# Patient Record
Sex: Male | Born: 1990 | Race: Black or African American | Hispanic: No | Smoking: Former smoker
Health system: Southern US, Community
[De-identification: ages and names within clinical notes are randomized; demographics above are authoritative.]

## PROBLEM LIST (undated history)

## (undated) DIAGNOSIS — D573 Sickle-cell trait: Secondary | ICD-10-CM

## (undated) DIAGNOSIS — J45909 Unspecified asthma, uncomplicated: Secondary | ICD-10-CM

## (undated) HISTORY — DX: Unspecified asthma, uncomplicated: J45.909

## (undated) HISTORY — DX: Sickle-cell trait: D57.3

---

## 2012-03-22 DIAGNOSIS — R7303 Prediabetes: Secondary | ICD-10-CM

## 2012-03-22 HISTORY — DX: Prediabetes: R73.03

## 2020-02-20 DIAGNOSIS — U071 COVID-19: Secondary | ICD-10-CM

## 2020-02-20 HISTORY — DX: COVID-19: U07.1

## 2020-09-15 ENCOUNTER — Other Ambulatory Visit: Payer: Self-pay

## 2020-09-15 ENCOUNTER — Emergency Department
Admission: EM | Admit: 2020-09-15 | Discharge: 2020-09-15 | Disposition: A | Discharge status: Home / Self Care | Payer: 59 | Attending: Emergency Medicine | Admitting: Emergency Medicine

## 2020-09-15 ENCOUNTER — Emergency Department: Payer: 59

## 2020-09-15 DIAGNOSIS — Y92481 Parking lot as the place of occurrence of the external cause: Secondary | ICD-10-CM | POA: Diagnosis not present

## 2020-09-15 DIAGNOSIS — M545 Low back pain, unspecified: Secondary | ICD-10-CM | POA: Insufficient documentation

## 2020-09-15 DIAGNOSIS — M542 Cervicalgia: Secondary | ICD-10-CM | POA: Insufficient documentation

## 2020-09-15 MED ORDER — METHOCARBAMOL 500 MG PO TABS
750.0000 mg | ORAL_TABLET | Freq: Once | ORAL | Status: AC
  Administered 2020-09-15: 750 mg via ORAL

## 2020-09-15 MED ORDER — MELOXICAM 7.5 MG PO TABS
15.0000 mg | ORAL_TABLET | Freq: Once | ORAL | Status: AC
  Administered 2020-09-15: 15 mg via ORAL
  Filled 2020-09-15: qty 2

## 2020-09-15 MED ORDER — ACETAMINOPHEN 325 MG PO TABS
650.0000 mg | ORAL_TABLET | Freq: Once | ORAL | Status: AC
  Administered 2020-09-15: 650 mg via ORAL
  Filled 2020-09-15: qty 2

## 2020-09-15 MED ORDER — MELOXICAM 15 MG PO TABS: 15.0000 mg | ORAL_TABLET | Freq: Every day | ORAL | 0 refills | Status: AC

## 2020-09-15 MED ORDER — METHOCARBAMOL 750 MG PO TABS: 750.0000 mg | ORAL_TABLET | Freq: Four times a day (QID) | ORAL | 0 refills | Status: AC | PRN
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
## 2020-09-15 NOTE — Discharge Instructions (Addendum)
Please take robaxin and mobic as prescribed. You  may also take Tylenol, up to 1000mg  4x daily as needed for pain. Return to the ER if you develop any worsening, otherwise follow up with your primary care.

## 2020-09-17 ENCOUNTER — Other Ambulatory Visit: Payer: Self-pay

## 2020-09-17 ENCOUNTER — Encounter: Payer: Self-pay | Admitting: Family Medicine

## 2020-09-17 ENCOUNTER — Ambulatory Visit: Payer: 59 | Admitting: Family Medicine

## 2020-09-17 VITALS — BP 122/74 | HR 64 | Temp 98.5°F | Ht 67.0 in | Wt 184.0 lb

## 2020-09-17 DIAGNOSIS — M62830 Muscle spasm of back: Secondary | ICD-10-CM | POA: Insufficient documentation

## 2020-09-17 DIAGNOSIS — Z23 Encounter for immunization: Secondary | ICD-10-CM | POA: Insufficient documentation

## 2020-09-17 DIAGNOSIS — M62838 Other muscle spasm: Secondary | ICD-10-CM

## 2020-09-17 MED ORDER — PREDNISONE 50 MG PO TABS: 50.0000 mg | ORAL_TABLET | Freq: Every day | ORAL | 0 refills | Status: DC
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
## 2020-09-17 NOTE — Patient Instructions (Signed)
-   Start meloxicam and dose once daily with food - Start methocarbamol, dose nightly (side effect can be drowsiness), additional 3 doses can be taken throughout the day (no driving/machinery for 8 hours after dosing) - If symptoms fail to respond to medications by Saturday, start prednisone and dose for full course - Start home exercises with information provided, focus on slow and steady advance using symptoms as a guide - Return for follow-up in 2 weeks, contact us for questions between now and then
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
## 2020-11-20 ENCOUNTER — Encounter: Payer: Self-pay | Admitting: Family Medicine

## 2020-11-20 ENCOUNTER — Other Ambulatory Visit: Payer: Self-pay

## 2020-11-20 ENCOUNTER — Ambulatory Visit (INDEPENDENT_AMBULATORY_CARE_PROVIDER_SITE_OTHER): Payer: 59 | Admitting: Family Medicine

## 2020-11-20 VITALS — BP 108/68 | HR 72 | Temp 98.4°F | Ht 67.0 in | Wt 178.0 lb

## 2020-11-20 DIAGNOSIS — M62838 Other muscle spasm: Secondary | ICD-10-CM | POA: Diagnosis not present

## 2020-11-20 DIAGNOSIS — J869 Pyothorax without fistula: Secondary | ICD-10-CM

## 2020-11-20 NOTE — Assessment & Plan Note (Addendum)
Patient presents 1 week following abscess incision and drainage at [facility] ER on 11/13/2020.  He has been compliant with antibiotic course, completed Bactrim, followed with Keflex which she is currently dosing, has an additional few days left.  Denies any fevers, chills, drainage from wound.  He can still feel thickening at the area of involvement.  Physical examination reveals 1 cm longitudinal midsternal well-healed/healing incision, mild induration surrounding, no erythema, no streaking, no fluctuance underneath, deep palpation does express scant amount of firm purulent material, further palpation benign.  This area is nontender.  Wound was cleaned and a new bandage was placed.  His physical exam findings show resolving chest abscess, have advised him to finish out remaining antibiotics, perform daily wound care, and contact us for any worsening symptoms.  He can otherwise follow-up on as-needed basis.

## 2020-11-20 NOTE — Patient Instructions (Signed)
-   Finish out remaining antibiotics for full course - Perform daily wound care, keep area clean and dry, gentle massage recommended - Contact us for any recurrence/worsening symptoms - Otherwise follow-up as needed

## 2020-11-20 NOTE — Assessment & Plan Note (Signed)
This is a chronic issue that is currently stable and improving, he continues to maintain home-based rehab exercises.  We can follow-up on an as-needed basis for this issue.

## 2020-11-20 NOTE — Progress Notes (Signed)
Primary Care / Sports Medicine Office Visit  Patient Information:  Patient ID: Samuel Kozar Sr., male DOB: [birth year] Age: 30 y.o. MRN: [id number]   Samuel Alkhatib Sr. is a pleasant 30 y.o. male presenting with the following:  Chief Complaint  Patient presents with   Cyst    Seen by [facility] ER 11/13/20 for cyst on chest; I&D preformed; finished taking bactrim and still taking keflex 4 times daily; having side effects of nausea   Cervical paraspinal muscle spasm    Symptoms are better since taking prednisone    Review of Systems pertinent details above   Patient Active Problem List   Diagnosis Date Noted   Abscess of chest (HCC) 11/20/2020   Cervical paraspinal muscle spasm 09/17/2020   Lumbar paraspinal muscle spasm 09/17/2020   Need for diphtheria-tetanus-pertussis (Tdap) vaccine 09/17/2020   Past Medical History:  Diagnosis Date   Asthma    COVID-19 02/2020   Prediabetes 2014   Sickle cell trait [facility])    Outpatient Encounter Medications as of 11/20/2020  Medication Sig   cephALEXin (KEFLEX) 500 MG capsule Take 500 mg by mouth 4 (four) times daily.   sulfamethoxazole-trimethoprim (BACTRIM DS) 800-160 MG tablet Take 2 tablets by mouth 2 (two) times daily.   [DISCONTINUED] acetaminophen (TYLENOL) 325 MG tablet Take 325 mg by mouth every 6 (six) hours as needed for moderate pain.   [DISCONTINUED] ibuprofen (ADVIL) 200 MG tablet Take 400 mg by mouth every 6 (six) hours as needed for moderate pain.   [DISCONTINUED] predniSONE (DELTASONE) 50 MG tablet Take 1 tablet (50 mg total) by mouth daily.   No facility-administered encounter medications on file as of 11/20/2020.   History reviewed. No pertinent surgical history.  Vitals:   11/20/20 0918  BP: 108/68  Pulse: 72  Temp: 98.4 F (36.9 C)  SpO2: 98%   Vitals:   11/20/20 0918  Weight: 178 lb (80.7 kg)  Height: 5\' 7"  (1.702 m)   Body mass index is 27.88 kg/m.  No results found.   Independent interpretation of notes  and tests performed by another provider:   None  Procedures performed:   None  Pertinent History, Exam, Impression, and Recommendations:   Abscess of chest [facility]) Patient presents 1 week following abscess incision and drainage at [facility] ER on 11/13/2020.  He has been compliant with antibiotic course, completed Bactrim, followed with Keflex which she is currently dosing, has an additional few days left.  Denies any fevers, chills, drainage from wound.  He can still feel thickening at the area of involvement.  Physical examination reveals 1 cm longitudinal midsternal well-healed/healing incision, mild induration surrounding, no erythema, no streaking, no fluctuance underneath, deep palpation does express scant amount of firm purulent material, further palpation benign.  This area is nontender.  Wound was cleaned and a new bandage was placed.  His physical exam findings show resolving chest abscess, have advised him to finish out remaining antibiotics, perform daily wound care, and contact 11/15/2020 for any worsening symptoms.  He can otherwise follow-up on as-needed basis.  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  We can follow-up on an as-needed basis for this issue.   Orders & Medications No orders of the defined types were placed in this encounter.  No orders of the defined types were placed in this encounter.    Return if symptoms worsen or fail to improve.  Montel Culver, MD   Primary Care Sports Medicine [place]

## 2020-12-01 ENCOUNTER — Encounter: Payer: Self-pay | Admitting: Family Medicine

## 2020-12-01 ENCOUNTER — Other Ambulatory Visit: Payer: Self-pay

## 2020-12-01 ENCOUNTER — Ambulatory Visit (INDEPENDENT_AMBULATORY_CARE_PROVIDER_SITE_OTHER): Payer: 59 | Admitting: Family Medicine

## 2020-12-01 VITALS — BP 122/64 | HR 72 | Temp 98.2°F | Ht 67.0 in | Wt 177.0 lb

## 2020-12-01 DIAGNOSIS — Z0000 Encounter for general adult medical examination without abnormal findings: Secondary | ICD-10-CM

## 2020-12-01 DIAGNOSIS — R7989 Other specified abnormal findings of blood chemistry: Secondary | ICD-10-CM | POA: Diagnosis not present

## 2020-12-01 DIAGNOSIS — Z13220 Encounter for screening for lipoid disorders: Secondary | ICD-10-CM | POA: Diagnosis not present

## 2020-12-01 NOTE — Progress Notes (Signed)
Annual Physical Exam Visit  Patient Information:  Patient ID: Samuel Baldree Sr., male DOB: [birth year] Age: 30 y.o. MRN: [id number]   Subjective:   CC: Annual Physical Exam  HPI:  Samuel Goodrich Sr. is here for their annual physical.  I reviewed the past medical history, family history, social history, surgical history, and allergies today and changes were made as necessary.  Please see the problem list section below for additional details.  Past Medical History: Past Medical History:  Diagnosis Date   Asthma    COVID-19 02/2020   Prediabetes 2014   Sickle cell trait (HCC)    Past Surgical History: History reviewed. No pertinent surgical history. Family History: Family History  Adopted: Yes  Problem Relation Age of Onset   Drug abuse Mother    Allergies: No Known Allergies Health Maintenance: Health Maintenance  Topic Date Due   HIV Screening  Never done   Hepatitis C Screening  Never done   COVID-19 Vaccine (1) 12/17/2020 (Originally 05/02/1991)   INFLUENZA VACCINE  06/19/2021 (Originally 10/20/2020)   TETANUS/TDAP  09/18/2030   Pneumococcal Vaccine 48-65 Years old  Aged Out   HPV VACCINES  Aged Out    HM Colonoscopy     This patient has no relevant Health Maintenance data.      Medications: No current outpatient medications on file prior to visit.   No current facility-administered medications on file prior to visit.    Review of Systems: No headache, visual changes, nausea, vomiting, diarrhea, constipation, dizziness, abdominal pain, skin rash, fevers, chills, night sweats, swollen lymph nodes, weight loss, chest pain, body aches, joint swelling, muscle aches, shortness of breath, mood changes, visual or auditory hallucinations reported.  Objective:   Vitals:   12/01/20 0846  BP: 122/64  Pulse: 72  Temp: 98.2 F (36.8 C)  SpO2: 98%   Vitals:   12/01/20 0846  Weight: 177 lb (80.3 kg)  Height: 5\' 7"  (1.702 m)   Body mass index is 27.72  kg/m.  General: Well Developed, well nourished, and in no acute distress.  Neuro: Alert and oriented x3, extra-ocular muscles intact, sensation grossly intact. Cranial nerves II through XII are intact, motor, sensory, and coordinative functions are all intact. HEENT: Normocephalic, atraumatic, pupils equal round reactive to light, neck supple, no masses, no lymphadenopathy, thyroid nonpalpable. Oropharynx, nasopharynx, external ear canals are unremarkable. Skin: Warm and dry, no rashes noted. Healing/near-healed central sternal wound Cardiac: Regular rate and rhythm, no murmurs rubs or gallops. No peripheral edema. Pulses symmetric. Respiratory: Clear to auscultation bilaterally. Not using accessory muscles, speaking in full sentences.  Abdominal: Soft, nontender, nondistended, positive bowel sounds, no masses, no organomegaly. Musculoskeletal: Shoulder, elbow, wrist, hip, knee, ankle stable, and with full range of motion.  Impression and Recommendations:   The patient was counselled, risk factors were discussed, and anticipatory guidance given.  Annual physical exam Annual physical examination complete, risk stratification labs ordered, anticipatory guidance provided.  He will return in 1 year for next exam.   Orders & Medications Medications: No orders of the defined types were placed in this encounter.  Orders Placed This Encounter  Procedures   TSH Rfx on Abnormal to Free T4   Lipid panel   CBC   Comprehensive metabolic panel   VITAMIN D 25 Hydroxy (Vit-D Deficiency, Fractures)   Hepatitis C Antibody   HIV antibody (with reflex)     Return in about 1 year (around 12/01/2021) for Annual Physical.    01/31/2022,  MD   [place]

## 2020-12-01 NOTE — Patient Instructions (Signed)
-   Obtain fasting labs with orders provided (can have water or black coffee but otherwise no food or drink x 8 hours before labs) - Review information provided - Attend eye doctor once per year, dentist every 6 months, work towards or maintain 30 minutes of moderate intensity physical activity, and consume a balanced diet - Return in 1 year for physical - Contact us for any questions between now and then

## 2020-12-01 NOTE — Assessment & Plan Note (Signed)
Annual physical examination complete, risk stratification labs ordered, anticipatory guidance provided.  He will return in 1 year for next exam.

## 2020-12-02 ENCOUNTER — Other Ambulatory Visit: Payer: Self-pay | Admitting: Family Medicine

## 2020-12-02 DIAGNOSIS — R7989 Other specified abnormal findings of blood chemistry: Secondary | ICD-10-CM

## 2020-12-02 DIAGNOSIS — R718 Other abnormality of red blood cells: Secondary | ICD-10-CM

## 2020-12-02 DIAGNOSIS — R7301 Impaired fasting glucose: Secondary | ICD-10-CM

## 2020-12-02 LAB — CBC
Hematocrit: 36.3 % — ABNORMAL LOW (ref 37.5–51.0)
Hemoglobin: 13.1 g/dL (ref 13.0–17.7)
MCH: 30 pg (ref 26.6–33.0)
MCHC: 36.1 g/dL — ABNORMAL HIGH (ref 31.5–35.7)
MCV: 83 fL (ref 79–97)
Platelets: 282 10*3/uL (ref 150–450)
RBC: 4.36 x10E6/uL (ref 4.14–5.80)
RDW: 11.9 % (ref 11.6–15.4)
WBC: 5 10*3/uL (ref 3.4–10.8)

## 2020-12-02 LAB — COMPREHENSIVE METABOLIC PANEL
ALT: 13 IU/L (ref 0–44)
AST: 16 IU/L (ref 0–40)
Albumin/Globulin Ratio: 2 (ref 1.2–2.2)
Albumin: 4.7 g/dL (ref 4.0–5.0)
Alkaline Phosphatase: 116 IU/L (ref 44–121)
BUN/Creatinine Ratio: 14 (ref 9–20)
BUN: 11 mg/dL (ref 6–20)
Bilirubin Total: 0.3 mg/dL (ref 0.0–1.2)
CO2: 21 mmol/L (ref 20–29)
Calcium: 9.2 mg/dL (ref 8.7–10.2)
Chloride: 107 mmol/L — ABNORMAL HIGH (ref 96–106)
Creatinine, Ser: 0.79 mg/dL (ref 0.76–1.27)
Globulin, Total: 2.3 g/dL (ref 1.5–4.5)
Glucose: 101 mg/dL — ABNORMAL HIGH (ref 65–99)
Potassium: 4 mmol/L (ref 3.5–5.2)
Sodium: 145 mmol/L — ABNORMAL HIGH (ref 134–144)
Total Protein: 7 g/dL (ref 6.0–8.5)
eGFR: 121 mL/min/{1.73_m2} (ref >59)

## 2020-12-02 LAB — LIPID PANEL
Chol/HDL Ratio: 3.5 ratio (ref 0.0–5.0)
Cholesterol, Total: 132 mg/dL (ref 100–199)
HDL: 38 mg/dL — ABNORMAL LOW (ref >39)
LDL Chol Calc (NIH): 73 mg/dL (ref 0–99)
Triglycerides: 113 mg/dL (ref 0–149)
VLDL Cholesterol Cal: 21 mg/dL (ref 5–40)

## 2020-12-02 LAB — HEPATITIS C ANTIBODY: Hep C Virus Ab: <0.1 s/co ratio (ref 0.0–0.9)

## 2020-12-02 LAB — TSH RFX ON ABNORMAL TO FREE T4: TSH: 0.74 u[IU]/mL (ref 0.450–4.500)

## 2020-12-02 LAB — HIV ANTIBODY (ROUTINE TESTING W REFLEX): HIV Screen 4th Generation wRfx: NONREACTIVE

## 2020-12-02 LAB — VITAMIN D 25 HYDROXY (VIT D DEFICIENCY, FRACTURES): Vit D, 25-Hydroxy: 18.1 ng/mL — ABNORMAL LOW (ref 30.0–100.0)

## 2020-12-02 NOTE — Progress Notes (Signed)
Samuel Glenn, your labs came back showing the following: - "good" cholesterol, HDL is low and sodium (salt) and chloride are high - recommend focusing on healthier diet, lean meats, and cutting back on salt intake - Your fasting blood sugar is high which can be a concern for diabetes - again watch the diet, minimize sweets, breads and try to get some walking after meals - Blood count shows some signs of possible anemia (low count) - I will order some additional labs for this and the suagrs - Lastly, vitamin D is low enough that I have sent a 8 week Rx course of vitamin D to take weekly  As above, I will place some lab orders (these do not have to be done fasting) that you can get done anytime. We will set up a visit in 3 months to recheck things in the clinic. Focus on diet and exercise healthy changes between now and then. I will reach out to you once the labs are back.

## 2020-12-09 ENCOUNTER — Telehealth: Payer: Self-pay

## 2020-12-09 NOTE — Telephone Encounter (Signed)
Please schedule patient for 3 month follow-up with labs a few days prior.  Will mail patient lab requisitions as a reminder.

## 2021-03-11 ENCOUNTER — Ambulatory Visit: Payer: 59 | Admitting: Family Medicine

## 2021-03-24 ENCOUNTER — Ambulatory Visit: Payer: 59 | Admitting: Family Medicine

## 2021-04-16 NOTE — Telephone Encounter (Signed)
Patient no-showed appointment on 03/24/21 (3 month follow-up).  Please reschedule at patient's convenience.  Patient should try to obtain fasting labs prior to appointment, but can get labs day of appointment also if more convenient.

## 2021-12-03 ENCOUNTER — Encounter: Payer: 59 | Admitting: Family Medicine
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
